# Patient Record
Sex: Male | Born: 1995 | Marital: Single
Health system: Southern US, Community
[De-identification: ages and names within clinical notes are randomized; demographics above are authoritative.]

---

## 2016-10-11 ENCOUNTER — Ambulatory Visit (INDEPENDENT_AMBULATORY_CARE_PROVIDER_SITE_OTHER): Payer: Self-pay | Admitting: Orthopedic Surgery

## 2016-10-11 DIAGNOSIS — M25521 Pain in right elbow: Secondary | ICD-10-CM

## 2016-10-15 NOTE — Progress Notes (Signed)
   Post-Op Visit Note   Patient: Charles Stein           Date of Birth: 12/07/1995           MRN: [id number] Visit Date: 10/11/2016 PCP: No primary care provider on file.   Assessment & Plan:  Chief Complaint: No chief complaint on file.  Visit Diagnoses:  1. Pain in right elbow     Plan: Patient is a baseball player with right elbow pain.  Started several weeks ago.  He used a strap over the proximal forearm which did not help.  He had a similar third episode last year which improved.  He is not taking any medication yet.  Hurts him every 3 past 90 feet.  Localizes the pain in the volar forearm region.  On exam is Good range of motion and good grip strength.  Negative Tinel's cubital tunnel in the elbow.  I'll detect a lot of pain or tenderness around the ulnar collateral ligament.  Does have some forearm pronator-type pain.  No lateral sided tenderness.  No paresthesias in the hand.  Impression is likely a Reese tendinitis.  UCL tear less likely but still possible.  I like to try him on a course of an anti-inflammatory first and then if that doesn't help then we could consider further imaging.  Diclofenac as prescribed.  We'll see him back in 3-4 weeks if he is no better.  Follow-Up Instructions: Return if symptoms worsen or fail to improve.   Orders:  No orders of the defined types were placed in this encounter.  No orders of the defined types were placed in this encounter.   Imaging: No results found.  PMFS History: There are no active problems to display for this patient.  No past medical history on file.  No family history on file.  No past surgical history on file. Social History   Occupational History  . Not on file.   Social History Main Topics  . Smoking status: Not on file  . Smokeless tobacco: Not on file  . Alcohol use Not on file  . Drug use: Unknown  . Sexual activity: Not on file

## 2017-11-11 ENCOUNTER — Encounter (HOSPITAL_COMMUNITY): Payer: Self-pay

## 2017-11-11 ENCOUNTER — Other Ambulatory Visit: Payer: Self-pay

## 2017-11-11 ENCOUNTER — Emergency Department (HOSPITAL_COMMUNITY)
Admission: EM | Admit: 2017-11-11 | Discharge: 2017-11-11 | Disposition: A | Discharge status: Home / Self Care | Payer: BC Managed Care – PPO | Attending: Emergency Medicine | Admitting: Emergency Medicine

## 2017-11-11 DIAGNOSIS — Z23 Encounter for immunization: Secondary | ICD-10-CM | POA: Insufficient documentation

## 2017-11-11 DIAGNOSIS — S61011A Laceration without foreign body of right thumb without damage to nail, initial encounter: Secondary | ICD-10-CM | POA: Diagnosis not present

## 2017-11-11 DIAGNOSIS — W2209XA Striking against other stationary object, initial encounter: Secondary | ICD-10-CM | POA: Diagnosis not present

## 2017-11-11 DIAGNOSIS — S6991XA Unspecified injury of right wrist, hand and finger(s), initial encounter: Secondary | ICD-10-CM | POA: Diagnosis present

## 2017-11-11 DIAGNOSIS — Y92320 Baseball field as the place of occurrence of the external cause: Secondary | ICD-10-CM | POA: Insufficient documentation

## 2017-11-11 DIAGNOSIS — Y9364 Activity, baseball: Secondary | ICD-10-CM | POA: Insufficient documentation

## 2017-11-11 DIAGNOSIS — Y998 Other external cause status: Secondary | ICD-10-CM | POA: Diagnosis not present

## 2017-11-11 MED ORDER — TETANUS-DIPHTH-ACELL PERTUSSIS 5-2.5-18.5 LF-MCG/0.5 IM SUSP
0.5000 mL | Freq: Once | INTRAMUSCULAR | Status: AC
  Administered 2017-11-11: 0.5 mL via INTRAMUSCULAR
  Filled 2017-11-11: qty 0.5

## 2017-11-11 MED ORDER — LIDOCAINE HCL 1 % IJ SOLN
5.0000 mL | Freq: Once | INTRAMUSCULAR | Status: AC
  Administered 2017-11-11: 5 mL via INTRADERMAL

## 2017-11-11 MED ORDER — OXYCODONE-ACETAMINOPHEN 5-325 MG PO TABS
1.0000 | ORAL_TABLET | Freq: Once | ORAL | Status: AC
  Administered 2017-11-11: 1 via ORAL
  Filled 2017-11-11: qty 1

## 2017-11-11 MED ORDER — EPINEPHRINE PF 1 MG/10ML IJ SOSY
PREFILLED_SYRINGE | INTRAMUSCULAR | Status: AC
  Filled 2017-11-11: qty 10

## 2017-11-11 MED ORDER — LIDOCAINE HCL 1 % IJ SOLN
INTRAMUSCULAR | Status: AC
  Filled 2017-11-11: qty 20

## 2017-11-11 NOTE — ED Provider Notes (Signed)
[facility]-EMERGENCY DEPT Provider Note   CSN: [id number] Arrival date & time: 11/11/17  1814     History   Chief Complaint Chief Complaint  Patient presents with  . Laceration    HPI Charles Stein is a 22 y.o. male.  HPI   Patient is a 22 year old male who presents today with a right thumb laceration that occurred suddenly while he was playing baseball prior to arrival.  Patient states that he slid into a base and hit the bottom of his thumb on a patient another person's cleat.  States he is able to move the thumb, does have pain to that area that is constant in nature.  Denies any pain to the rest of the hands and fingers.  Denies any numbness.  He has not taken anything for the pain.  No past medical history on file.  There are no active problems to display for this patient.   No PMHx    Home Medications    Prior to Admission medications   Not on File    Family History No family history on file.  Social History Social History   Tobacco Use  . Smoking status: Not on file  Substance Use Topics  . Alcohol use: Yes    Comment: occasionally  . Drug use: No     Allergies   Patient has no known allergies.   Review of Systems Review of Systems  Constitutional: Negative for fever.  Musculoskeletal:       Right hand pain  Skin:       Laceration to right thumb     Physical Exam Updated Vital Signs BP 117/71 (BP Location: Left Arm)   Pulse (!) 54   Temp 98.5 F (36.9 C) (Oral)   Resp 16   Ht 6\' 1"  (1.854 m)   Wt 83 kg (183 lb)   SpO2 100%   BMI 24.14 kg/m   Physical Exam  Constitutional: He is oriented to person, place, and time. He appears well-developed and well-nourished. No distress.  Eyes: Conjunctivae are normal.  Cardiovascular: Normal rate and regular rhythm.  Pulmonary/Chest: Effort normal and breath sounds normal.  Neurological: He is alert and oriented to person, place, and time.  Skin: Skin is warm and dry.   2.5 cm curved laceration to the base of the right thumb lateral aspect.  Bleeding is controlled.  Base of the wound was viewed and no foreign body noted.  No tendon involvement.  Sensation is intact to the thumb distally.  He has good grip strength as well as pincer strength, finger abduction, thumb extension bilaterally.  Good cap refill.  Flexion/extension to the wrist is strong.  No tenderness to the carpals or metacarpals.  No snuffbox tenderness.     ED Treatments / Results  Labs (all labs ordered are listed, but only abnormal results are displayed) Labs Reviewed - No data to display  EKG  EKG Interpretation None       Radiology No results found.  Procedures .Marland Kitchen.Laceration Repair Date/Time: 11/12/2017 1:51 AM Performed by: Karrie Meresouture, Marc Leichter S, PA-C Authorized by: Karrie Meresouture, Tarique Loveall S, PA-C   Consent:    Consent obtained:  Verbal   Consent given by:  Patient   Risks discussed:  Infection and pain Anesthesia (see MAR for exact dosages):    Anesthesia method:  Local infiltration   Local anesthetic:  Lidocaine 2% w/o epi Laceration details:    Location:  Finger   Finger location:  R thumb  Wound length (cm): 2.5. Repair type:    Repair type:  Simple Pre-procedure details:    Preparation:  Patient was prepped and draped in usual sterile fashion Exploration:    Wound exploration: wound explored through full range of motion and entire depth of wound probed and visualized     Wound extent: no fascia violation noted, no foreign bodies/material noted, no muscle damage noted, no tendon damage noted and no vascular damage noted     Contaminated: no   Treatment:    Area cleansed with:  Saline   Amount of cleaning:  Extensive   Irrigation solution:  Sterile saline   Irrigation volume:    Irrigation method:  Pressure wash   Visualized foreign bodies/material removed: no   Skin repair:    Repair method:  Sutures   Suture size:  5-0   Suture material:  Prolene   Suture  technique:  Simple interrupted   Number of sutures:  5 Approximation:    Approximation:  Close   Vermilion border: well-aligned   Post-procedure details:    Dressing:  Antibiotic ointment and non-adherent dressing   Patient tolerance of procedure:  Tolerated well, no immediate complications   (including critical care time)  Medications Ordered in ED Medications  lidocaine (XYLOCAINE) 1 % (with pres) injection (not administered)  Tdap (BOOSTRIX) injection 0.5 mL (0.5 mLs Intramuscular Given 11/11/17 2129)  lidocaine (XYLOCAINE) 1 % (with pres) injection 5 mL (5 mLs Intradermal Given by Other 11/11/17 2129)  oxyCODONE-acetaminophen (PERCOCET/ROXICET) 5-325 MG per tablet 1 tablet (1 tablet Oral Given 11/11/17 2129)     Initial Impression / Assessment and Plan / ED Course  I have reviewed the triage vital signs and the nursing notes.  Pertinent labs & imaging results that were available during my care of the patient were reviewed by me and considered in my medical decision making (see chart for details).  Clinical Course as of Nov 13 151  Fri Nov 11, 2017  2230 Patient with laceration to space between first and second digits on the right hand.  He is got good strength and sensation and no evidence of tendon involvement on exploration.  We are going to do a primary closure  [MB]    Clinical Course User Index [MB] Terrilee Files, MD     Final Clinical Impressions(s) / ED Diagnoses   Final diagnoses:  Laceration of right thumb without foreign body without damage to nail, initial encounter   Pressure irrigation performed. Wound explored and base of wound visualized in a bloodless field without evidence of foreign body.  Laceration occurred < 8 hours prior to repair which was well tolerated.  Tdap updated.  Pt has  no comorbidities to effect normal wound healing. Pt discharged  without antibiotics.  Discussed suture home care with patient and answered questions. Pt to follow-up for  wound check and suture removal in 10-14 days; they are to return to the ED sooner for signs of infection. Pt is hemodynamically stable with no complaints prior to dc.   ED Discharge Orders    None       Rayne Du 11/12/17 0153    Terrilee Files, MD 11/12/17 1300

## 2017-11-11 NOTE — Discharge Instructions (Signed)
Please keep the area clean and dry. Please return to the ER, urgent care, or your primary care doctor for suture removal within 10-14 days.  Please return to the ER sooner if you have any signs of infection including any fevers, worsening pain, swelling, drainage, or redness.  Also return if you have any numbness or inability move the right thumb.

## 2018-01-09 ENCOUNTER — Ambulatory Visit (INDEPENDENT_AMBULATORY_CARE_PROVIDER_SITE_OTHER): Payer: Self-pay | Admitting: Orthopedic Surgery

## 2018-01-09 DIAGNOSIS — M79671 Pain in right foot: Secondary | ICD-10-CM

## 2018-01-16 ENCOUNTER — Encounter (INDEPENDENT_AMBULATORY_CARE_PROVIDER_SITE_OTHER): Payer: Self-pay | Admitting: Orthopedic Surgery

## 2018-01-16 ENCOUNTER — Other Ambulatory Visit (INDEPENDENT_AMBULATORY_CARE_PROVIDER_SITE_OTHER): Payer: Self-pay

## 2018-01-16 DIAGNOSIS — M79671 Pain in right foot: Secondary | ICD-10-CM

## 2018-01-16 NOTE — Progress Notes (Signed)
   Post-Op Visit Note   Patient: Charles Stein           Date of Birth: 07/19/1996           MRN: [id number] Visit Date: 01/09/2018 PCP: Patient, No Pcp Per   Assessment & Plan:  Chief Complaint: No chief complaint on file.  Visit Diagnoses:  1. Right foot pain     Plan: Charles Stein is a 22 year old with right foot pain.  He is a Print production plannerbaseball player.  He will fell ball off the medial aspect of his right foot.  Hard for him to run and weight-bear.  It swelled up the first 2 days.  Plain radiographs normal.  Not taking any medications.  He plays outfield on the baseball team.  On examination the patient has mild swelling.  Patient has palpable intact nontender anterior to posterior tib peroneal and Achilles tendon.  No pain with pronation supination of the forefoot.  Does have tenderness medially around the first tarsometatarsal articulation.  No calcaneal pain.  Impression is pretty hard hit ball to the medial foot with appropriate mechanism of injury for possible fracture.  Plain radiographs normal.  At the time of this dictation the patient has had persistent symptoms for the past 7 days.  Plan CT scan to rule out acute fracture which is non-apparent on plain radiographs.  I will keep Charles Stein informed of the results of the study.  Okay to weight-bear as tolerated until then based on plain radiographs.  Follow-Up Instructions: Return if symptoms worsen or fail to improve.   Orders:  No orders of the defined types were placed in this encounter.  No orders of the defined types were placed in this encounter.   Imaging: No results found.  PMFS History: There are no active problems to display for this patient.  History reviewed. No pertinent past medical history.  History reviewed. No pertinent family history.  History reviewed. No pertinent surgical history. Social History   Occupational History  . Not on file  Tobacco Use  . Smoking status: Not on file  Substance and Sexual Activity    . Alcohol use: Yes    Comment: occasionally  . Drug use: No  . Sexual activity: Not on file

## 2018-01-19 ENCOUNTER — Ambulatory Visit
Admission: RE | Admit: 2018-01-19 | Discharge: 2018-01-19 | Disposition: A | Discharge status: Home / Self Care | Payer: No Typology Code available for payment source | Source: Ambulatory Visit | Attending: Orthopedic Surgery | Admitting: Orthopedic Surgery

## 2018-01-19 DIAGNOSIS — M79671 Pain in right foot: Secondary | ICD-10-CM

## 2018-05-07 IMAGING — CT CT FOOT*R* W/O CM
4 of 10 series · 11 of 33 positions shown, 12 images · non-contrast
Comparison: None.

CLINICAL DATA: Hit with a baseball along the medial dorsal aspect
of the right foot. Injured 3 weeks ago.

EXAM:
CT OF THE RIGHT FOOT WITHOUT CONTRAST
TECHNIQUE: Multidetector CT imaging of the right foot was performed according
to the standard protocol. Multiplanar CT image reconstructions were
also generated.

[Series 5: sfov lower extremity 2.00 br60 s3 cor · coronal · 0.19mm/px · 1 of 140 slices shown]
[im 70/140  bone]
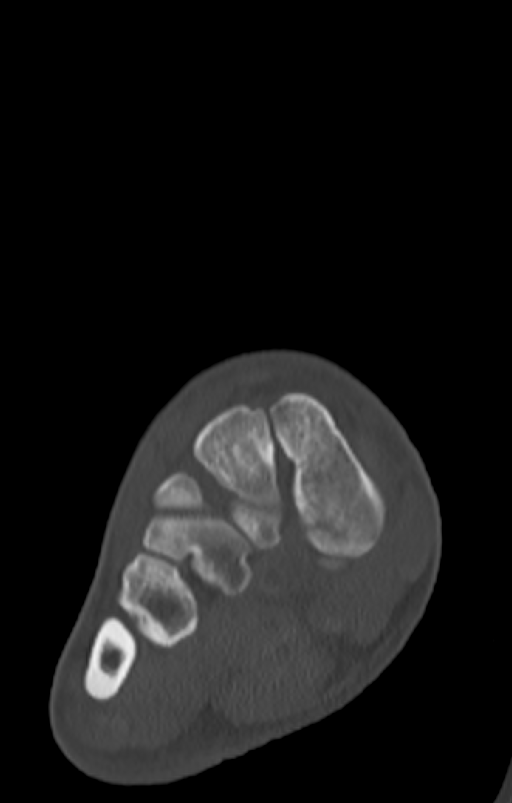

[Series 7: sfov lower extremity 2.00 br60 s3 sag · sagittal · 0.30mm/px · 5 of 49 slices shown]
[im 9/49  bone]
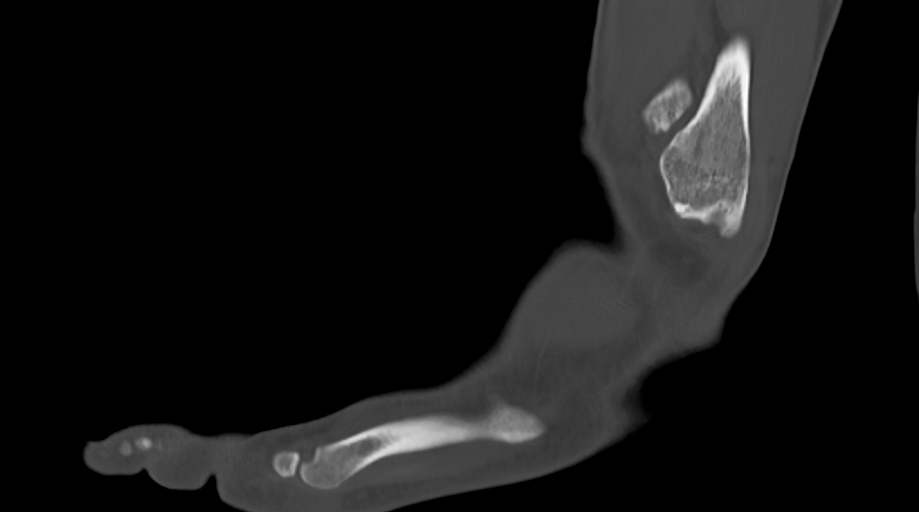
[im 17/49  bone]
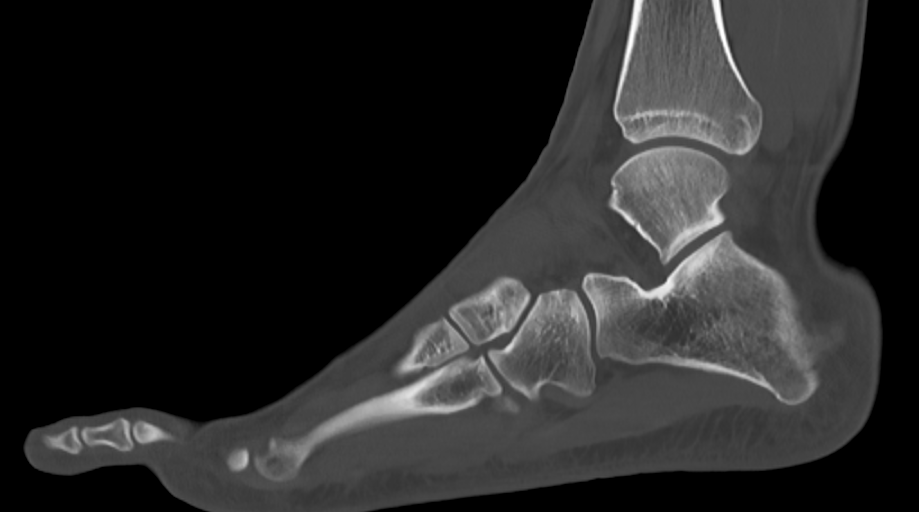
[im 25/49  bone]
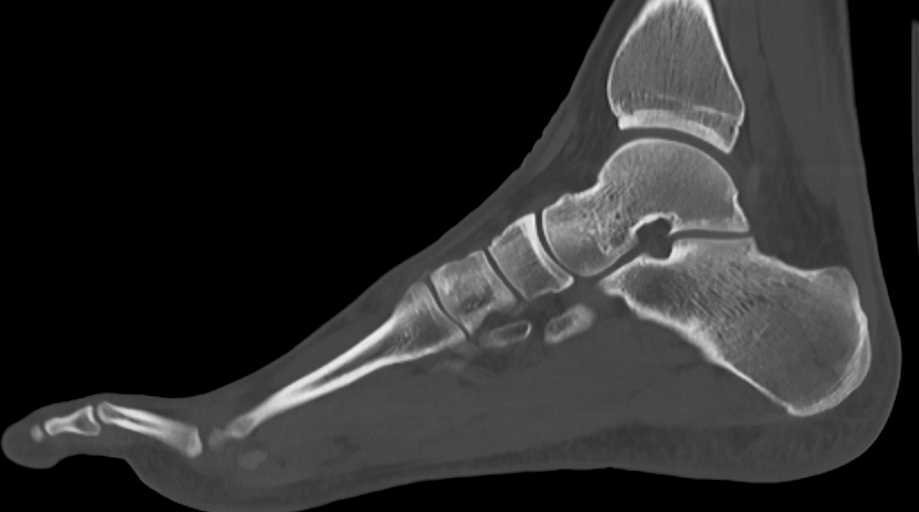
[im 33/49  bone]
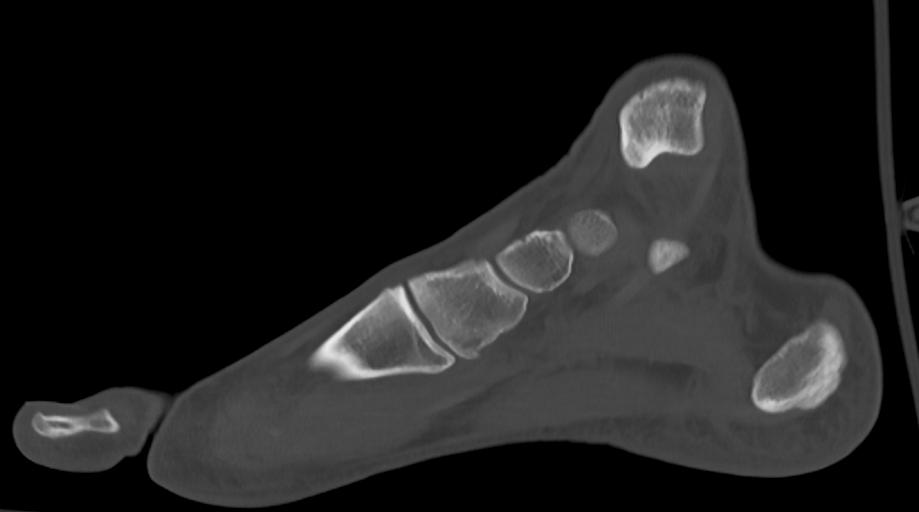
[im 41/49  bone]
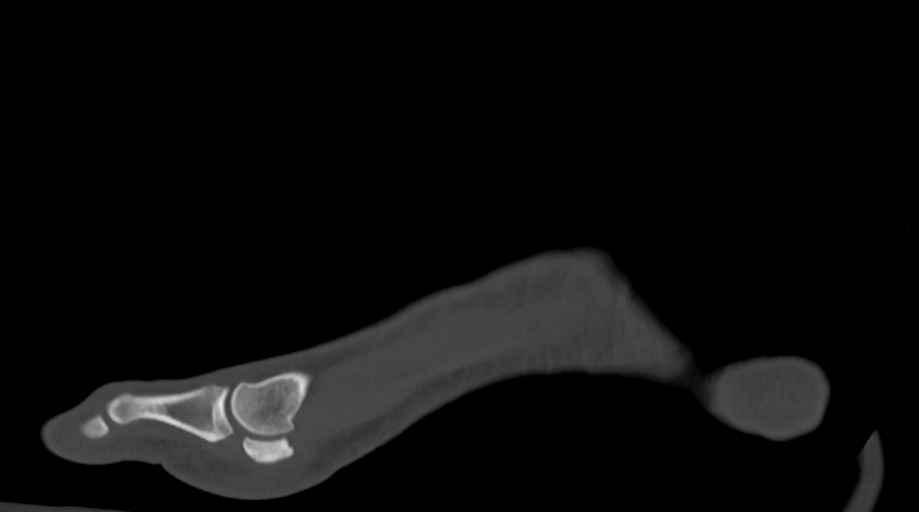

[Series 15: sfov lower extremity 0.80 br60 s3 ax · axial · 0.19mm/px · 1 of 518 slices shown]
[im 173/518  bone]
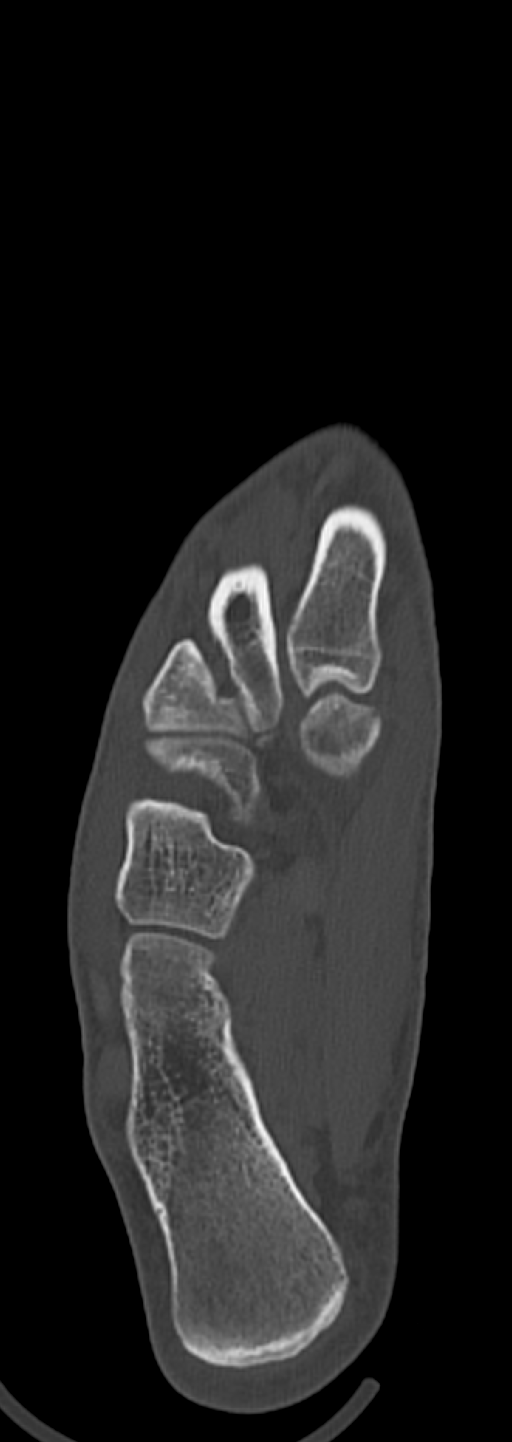

[Series 17: sfov lower extremity 0.60 br40 s3 · axial · 0.54mm/px · z∈[+626,+724]mm · 4 of 545 slices shown, 5 images]
[im 109/545  soft-tissue]
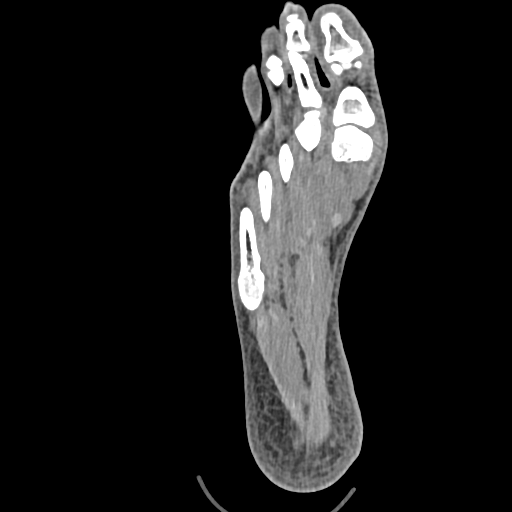
[im 109/545  bone]
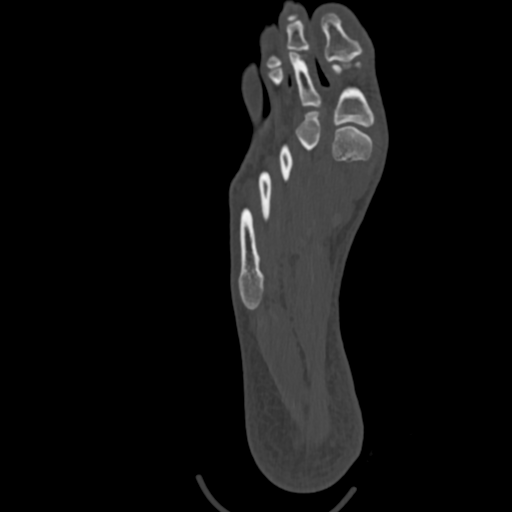
[im 218/545  bone]
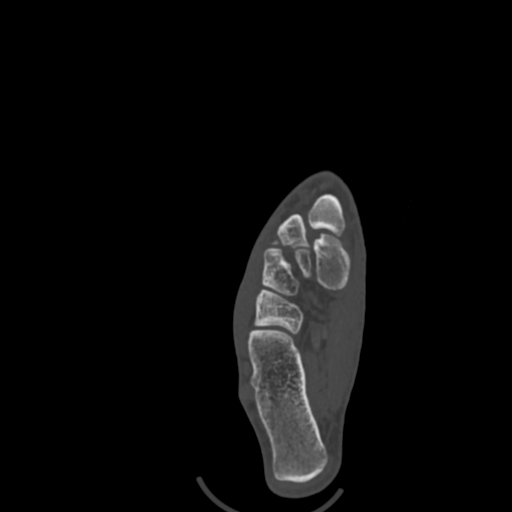
[im 327/545  bone]
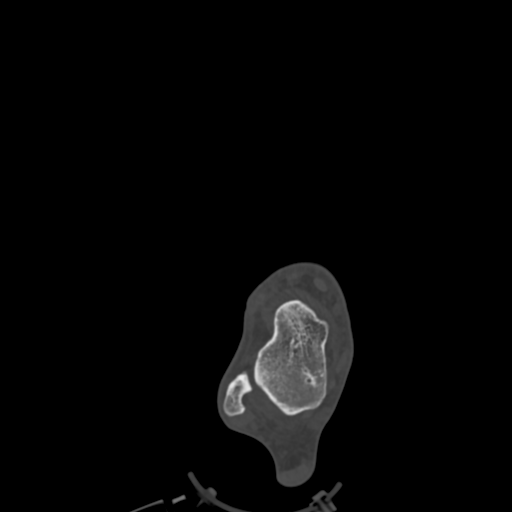
[im 436/545  bone]
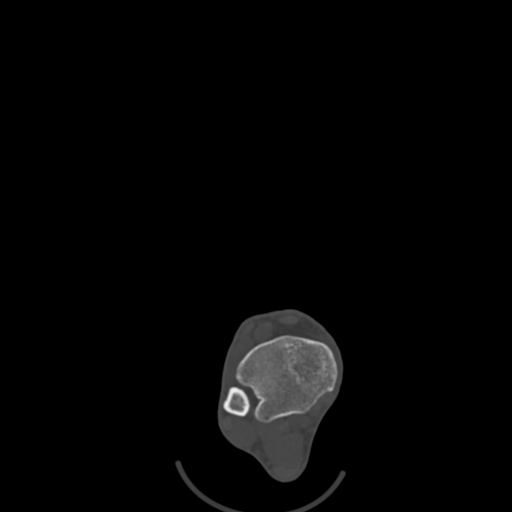

[11 of 33 positions shown; findings below may reference images not displayed]

FINDINGS: Bones/Joint/Cartilage

No acute fracture or dislocation. Sclerosis and fragmentation of the
medial hallux sesamoid as can be seen with chronic sesamoiditis.
Normal alignment. No joint effusion. Ankle mortise is intact.
Subtalar joints are normal. Joint spaces are maintained.

Ligaments

Ligaments are suboptimally evaluated by CT.

Muscles and Tendons
Muscles are normal. Flexor, extensor, peroneal and Achilles tendons
are intact.

Soft tissue
No fluid collection or hematoma.  No soft tissue mass.
IMPRESSION: 1. No acute osseous injury of right foot.
2. Sclerosis and fragmentation of the medial hallux sesamoid as can
be seen with chronic sesamoiditis.

## 2018-11-13 ENCOUNTER — Ambulatory Visit (INDEPENDENT_AMBULATORY_CARE_PROVIDER_SITE_OTHER): Payer: Self-pay | Admitting: Orthopedic Surgery

## 2018-11-13 DIAGNOSIS — M25511 Pain in right shoulder: Secondary | ICD-10-CM

## 2018-11-18 ENCOUNTER — Encounter (INDEPENDENT_AMBULATORY_CARE_PROVIDER_SITE_OTHER): Payer: Self-pay | Admitting: Orthopedic Surgery

## 2018-11-18 NOTE — Progress Notes (Signed)
   Post-Op Visit Note   Patient: Charles Stein           Date of Birth: [birth year]           MRN: [id number] Visit Date: 11/13/2018 PCP: Patient, No Pcp Per   Assessment & Plan:  Chief Complaint: No chief complaint on file.  Visit Diagnoses:  1. 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  He was able to throw the ball after that.  He reports tenderness in the distal clavicle region.  Fluoroscopic images are reviewed and do not show any definite displaced distal clavicle fracture.  On examination he does have swelling and tenderness around the distal clavicle.  No real deformity or instability of the [facility] joint.  Rotator cuff strength on the right-hand side intact.  Plan at this time is for observation.  I think he is making improvements with his range of motion and strength.  Would be good to see him back in 1 to 2 weeks just to make sure he is progressing.  This looks like it should be a self-limited injury.  Follow-Up Instructions: Return if symptoms worsen or fail to improve.   Orders:  No orders of the defined types were placed in this encounter.  No orders of the defined types were placed in this encounter.   Imaging: No results found.  PMFS History: There are no active problems to display for this patient.  History reviewed. No pertinent past medical history.  History reviewed. No pertinent family history.  History reviewed. No pertinent surgical history. Social History   Occupational History  . Not on file  Tobacco Use  . Smoking status: Not on file  Substance and Sexual Activity  . Alcohol use: Yes    Comment: occasionally  . Drug use: No  . Sexual activity: Not on file

## 2018-11-20 ENCOUNTER — Ambulatory Visit (INDEPENDENT_AMBULATORY_CARE_PROVIDER_SITE_OTHER): Payer: Self-pay | Admitting: Orthopedic Surgery

## 2018-11-20 DIAGNOSIS — M25511 Pain in right shoulder: Secondary | ICD-10-CM

## 2018-11-27 ENCOUNTER — Encounter (INDEPENDENT_AMBULATORY_CARE_PROVIDER_SITE_OTHER): Payer: Self-pay | Admitting: Orthopedic Surgery

## 2018-11-27 NOTE — Progress Notes (Signed)
   Post-Op Visit Note   Patient: Charles Stein           Date of Birth: Jun 15, 1996           MRN: [id number] Visit Date: 11/20/2018 PCP: Patient, No Pcp Per   Assessment & Plan:  Chief Complaint: No chief complaint on file.  Visit Diagnoses:  1. Acute pain of right shoulder     Plan: Charles Stein is a patient who is now 9 days out right grade 2 AC injury.  On exam the swelling is diminished.  His range of motion is improving.  Looked at it again under x-ray and I do not see a definite fracture line in the distal third (lateral third) of the clavicle.  I will have him continue to work on range of motion and rehab.  Anticipate about 3 weeks total before he can return to play.  Follow-Up Instructions: Return if symptoms worsen or fail to improve.   Orders:  No orders of the defined types were placed in this encounter.  No orders of the defined types were placed in this encounter.   Imaging: No results found.  PMFS History: There are no active problems to display for this patient.  History reviewed. No pertinent past medical history.  History reviewed. No pertinent family history.  History reviewed. No pertinent surgical history. Social History   Occupational History  . Not on file  Tobacco Use  . Smoking status: Not on file  Substance and Sexual Activity  . Alcohol use: Yes    Comment: occasionally  . Drug use: No  . Sexual activity: Not on file
